# Patient Record
Sex: Female | Born: 1985 | Race: White | Hispanic: No | Marital: Married
Health system: Southern US, Community
[De-identification: ages and names within clinical notes are randomized; demographics above are authoritative.]

---

## 2019-12-09 ENCOUNTER — Telehealth: Payer: Self-pay | Admitting: Oncology

## 2019-12-09 NOTE — Telephone Encounter (Signed)
Received a new hem referral from Dr. Farris Has for low ferritin. Jodi Gibson returned my call and has been scheduled to see Dr. Clelia Croft on 8/27 at 11am. She has been made aware to arrive 15 minutes early.

## 2019-12-24 ENCOUNTER — Inpatient Hospital Stay: Payer: PRIVATE HEALTH INSURANCE | Attending: Oncology | Admitting: Oncology

## 2019-12-24 ENCOUNTER — Other Ambulatory Visit: Payer: Self-pay

## 2019-12-24 VITALS — BP 110/66 | HR 58 | Temp 96.1°F | Resp 18 | Wt 144.7 lb

## 2019-12-24 DIAGNOSIS — Z79899 Other long term (current) drug therapy: Secondary | ICD-10-CM | POA: Diagnosis not present

## 2019-12-24 DIAGNOSIS — E611 Iron deficiency: Secondary | ICD-10-CM | POA: Diagnosis present

## 2019-12-24 DIAGNOSIS — D509 Iron deficiency anemia, unspecified: Secondary | ICD-10-CM

## 2019-12-24 NOTE — Progress Notes (Signed)
Reason for the request:    Iron deficiency  HPI: I was asked by Dr. Kateri Plummer to evaluate Jodi Gibson for the evaluation of iron deficiency.  She is a 34 year old woman without any significant comorbid conditions was diagnosed with iron deficiency in the last year.  She was started on iron replacement by Dr. Kateri Plummer and has been taking it for the last year.  She had issues with dyspepsia and constipation but despite that she was compliant with it.  Repeat iron studies on November 30, 2019 showed ferritin of 7.8, iron level of 15 and iron binding capacity increased to 498.  Her hemoglobin was 13.3 with normal CBC otherwise.  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  She denies hematochezia, melena or hemoptysis.  She does have regular menstrual cycles but have not been heavy.  She does report some fatigue and some tiredness in addition to her alopecia.  She does not report any headaches, blurry vision, syncope or seizures. Does not report any fevers, chills or sweats.  Does not report any cough, wheezing or hemoptysis.  Does not report any chest pain, palpitation, orthopnea or leg edema.  Does not report any nausea, vomiting or abdominal pain.  Does not report any constipation or diarrhea.  Does not report any skeletal complaints.    Does not report frequency, urgency or hematuria.  Does not report any skin rashes or lesions. Does not report any heat or cold intolerance.  Does not report any lymphadenopathy or petechiae.  Does not report any anxiety or depression.  Remaining review of systems is negative.    No past medical history significant for hypertension, diabetes or coronary disease.    Social History   Socioeconomic History  . Marital status: Married    Spouse name: Not on file  . Number of children: Not on file  . Years of education: Not on file  . Highest education level: Not on file  Occupational History  . Not on file  Tobacco  Use  . Smoking status: Not on file  Substance and Sexual Activity  . Alcohol use: Not on file  . Drug use: Not on file  . Sexual activity: Not on file  Other Topics Concern  . Not on file  Social History Narrative  . Not on file   Social Determinants of Health   Financial Resource Strain:   . Difficulty of Paying Living Expenses: Not on file  Food Insecurity:   . Worried About Programme researcher, broadcasting/film/video in the Last Year: Not on file  . Ran Out of Food in the Last Year: Not on file  Transportation Needs:   . Lack of Transportation (Medical): Not on file  . Lack of Transportation (Non-Medical): Not on file  Physical Activity:   . Days of Exercise per Week: Not on file  . Minutes of Exercise per Session: Not on file  Stress:   . Feeling of Stress : Not on file  Social Connections:   . Frequency of Communication with Friends and Family: Not on file  . Frequency of Social Gatherings with Friends and Family: Not on file  . Attends Religious Services: Not on file  . Active Member of Clubs or Organizations: Not on file  . Attends Banker Meetings: Not on file  . Marital Status: Not on file  Intimate Partner Violence:   . Fear of Current or Ex-Partner: Not on file  . Emotionally Abused:  Not on file  . Physically Abused: Not on file  . Sexually Abused: Not on file  :  Pertinent items are noted in HPI.  Exam:  Blood pressure 110/66, pulse (!) 58, temperature (!) 96.1 F (35.6 C), temperature source Tympanic, resp. rate 18, weight 144 lb 11.2 oz (65.6 kg), SpO2 100 %.   ECOG 0 General appearance: alert and cooperative appeared without distress. Head: atraumatic without any abnormalities. Eyes: conjunctivae/corneas clear. PERRL.  Sclera anicteric. Throat: lips, mucosa, and tongue normal; without oral thrush or ulcers. Resp: clear to auscultation bilaterally without rhonchi, wheezes or dullness to percussion. Cardio: regular rate and rhythm, S1, S2 normal, no murmur,  click, rub or gallop GI: soft, non-tender; bowel sounds normal; no masses,  no organomegaly Skin: Skin color, texture, turgor normal. No rashes or lesions Lymph nodes: Cervical, supraclavicular, and axillary nodes normal. Neurologic: Grossly normal without any motor, sensory or deep tendon reflexes. Musculoskeletal: No joint deformity or effusion.     Assessment and Plan:   34 year old with:  1.  Iron deficiency diagnosed 2020 after presenting with low ferritin and normal hemoglobin since that time.  After oral iron replacement her ferritin continues to be low at 7.8 despite taking it regularly with excellent compliance with vitamin C.  The differential diagnosis of her iron deficiency was reviewed today.  She is likely has developed for iron absorption through her GI tract that will likely lead to need for intravenous iron.  Given her poor tolerance and the lack of effectiveness IV iron replacement remains the standard of care at this time.  There is no evidence of GI blood losses and chronic menstrual blood losses could also be contributing.  Risks and benefits of Feraheme infusion were reviewed today.  Complications such as arthralgias, myalgias and infusion related reactions were reviewed.  Rarely anaphylaxis could be documented.  After discussion today she is agreeable to proceed at this time.  2.  Follow-up: I have recommended repeating iron studies in 3 to 4 months to ensure adequate replacement.   45  minutes were dedicated to this visit. The time was spent on reviewing laboratory data, discussing treatment options, discussing differential diagnosis and answering questions regarding future plan.    A copy of this consult has been forwarded to the requesting physician.

## 2019-12-27 ENCOUNTER — Telehealth: Payer: Self-pay | Admitting: Oncology

## 2019-12-27 NOTE — Telephone Encounter (Signed)
Scheduled per 08/27 los, patient has been called and voicemail was left. 

## 2019-12-28 ENCOUNTER — Inpatient Hospital Stay: Payer: PRIVATE HEALTH INSURANCE

## 2019-12-28 ENCOUNTER — Telehealth: Payer: Self-pay | Admitting: Oncology

## 2019-12-28 NOTE — Telephone Encounter (Signed)
Scheduled per 08/27 los, called patient regarding upcoming scheduled appointments. Left a voicemail.

## 2019-12-30 ENCOUNTER — Inpatient Hospital Stay: Payer: PRIVATE HEALTH INSURANCE

## 2020-01-07 ENCOUNTER — Inpatient Hospital Stay: Payer: PRIVATE HEALTH INSURANCE

## 2020-01-07 ENCOUNTER — Inpatient Hospital Stay: Payer: PRIVATE HEALTH INSURANCE | Attending: Oncology

## 2020-01-07 ENCOUNTER — Other Ambulatory Visit: Payer: Self-pay

## 2020-01-07 VITALS — BP 102/65 | HR 51 | Temp 98.3°F | Resp 18

## 2020-01-07 DIAGNOSIS — D509 Iron deficiency anemia, unspecified: Secondary | ICD-10-CM

## 2020-01-07 DIAGNOSIS — E611 Iron deficiency: Secondary | ICD-10-CM | POA: Diagnosis present

## 2020-01-07 MED ORDER — SODIUM CHLORIDE 0.9 % IV SOLN
Freq: Once | INTRAVENOUS | Status: AC
  Filled 2020-01-07: qty 250

## 2020-01-07 MED ORDER — SODIUM CHLORIDE 0.9 % IV SOLN
510.0000 mg | Freq: Once | INTRAVENOUS | Status: AC
  Administered 2020-01-07: 510 mg via INTRAVENOUS
  Filled 2020-01-07: qty 510

## 2020-01-07 NOTE — Patient Instructions (Signed)

## 2020-01-18 ENCOUNTER — Inpatient Hospital Stay: Payer: PRIVATE HEALTH INSURANCE

## 2020-01-18 ENCOUNTER — Other Ambulatory Visit: Payer: Self-pay

## 2020-01-18 VITALS — BP 96/58 | HR 47 | Temp 98.7°F | Resp 18

## 2020-01-18 DIAGNOSIS — D509 Iron deficiency anemia, unspecified: Secondary | ICD-10-CM

## 2020-01-18 DIAGNOSIS — E611 Iron deficiency: Secondary | ICD-10-CM | POA: Diagnosis not present

## 2020-01-18 MED ORDER — SODIUM CHLORIDE 0.9 % IV SOLN
510.0000 mg | Freq: Once | INTRAVENOUS | Status: AC
  Administered 2020-01-18: 510 mg via INTRAVENOUS
  Filled 2020-01-18: qty 17

## 2020-01-18 MED ORDER — SODIUM CHLORIDE 0.9 % IV SOLN
Freq: Once | INTRAVENOUS | Status: AC
  Filled 2020-01-18: qty 250

## 2020-01-18 NOTE — Patient Instructions (Signed)

## 2020-05-31 ENCOUNTER — Ambulatory Visit
Admission: RE | Admit: 2020-05-31 | Discharge: 2020-05-31 | Disposition: A | Discharge status: Home / Self Care | Payer: PRIVATE HEALTH INSURANCE | Source: Ambulatory Visit | Attending: Family Medicine | Admitting: Family Medicine

## 2020-05-31 ENCOUNTER — Other Ambulatory Visit: Payer: Self-pay

## 2020-05-31 ENCOUNTER — Other Ambulatory Visit: Payer: Self-pay | Admitting: Family Medicine

## 2020-05-31 DIAGNOSIS — R0602 Shortness of breath: Secondary | ICD-10-CM

## 2021-04-16 ENCOUNTER — Ambulatory Visit (HOSPITAL_COMMUNITY)
Admission: RE | Admit: 2021-04-16 | Discharge: 2021-04-16 | Disposition: A | Discharge status: Home / Self Care | Payer: No Typology Code available for payment source | Source: Ambulatory Visit | Attending: General Surgery | Admitting: General Surgery

## 2021-04-16 ENCOUNTER — Other Ambulatory Visit: Payer: No Typology Code available for payment source | Admitting: General Surgery

## 2021-04-16 ENCOUNTER — Encounter: Payer: Self-pay | Admitting: Oncology

## 2021-04-16 ENCOUNTER — Other Ambulatory Visit: Payer: Self-pay | Admitting: General Surgery

## 2021-04-16 ENCOUNTER — Other Ambulatory Visit: Payer: Self-pay

## 2021-04-16 DIAGNOSIS — M7989 Other specified soft tissue disorders: Secondary | ICD-10-CM | POA: Insufficient documentation

## 2021-04-16 DIAGNOSIS — Z09 Encounter for follow-up examination after completed treatment for conditions other than malignant neoplasm: Secondary | ICD-10-CM

## 2021-04-16 NOTE — Progress Notes (Signed)
Acute right leg swelling

## 2023-11-04 ENCOUNTER — Ambulatory Visit (INDEPENDENT_AMBULATORY_CARE_PROVIDER_SITE_OTHER): Admitting: Podiatry

## 2023-11-04 ENCOUNTER — Encounter: Payer: Self-pay | Admitting: Podiatry

## 2023-11-04 DIAGNOSIS — L603 Nail dystrophy: Secondary | ICD-10-CM

## 2023-11-04 NOTE — Progress Notes (Signed)
  Subjective:  Patient ID: Jodi Gibson, female    DOB: 04/30/1985,   MRN: [id number]  Chief Complaint  Patient presents with   Nail Problem    Patient states that she hit her right hallux toe nail on step walking into her house about a month ago.  her toe nail got caught and came off, her nail grew back but now it hurts on the lateral side of her right hallux and patient would like to know how to fix her nail due to it becoming a pinched nail, and hurting on lateral and medial sides of right hallux. No mediation for pain, no drainage    38 y.o. female presents for concern of right great toenail. Relates she tripped and part of the nail was ripped off. This occurred about a month ago. Now the nail has grown back and hurting on the lateral side of the nail. She is not sure what to do for it. Denies any treatments.  Denies any other pedal complaints. Denies n/v/f/c.   History reviewed. No pertinent past medical history.  Objective:  Physical Exam: Vascular: DP/PT pulses 2/4 bilateral. CFT <3 seconds. Normal hair growth on digits. No edema.  Skin. No lacerations or abrasions bilateral feet. Right hallux nail lifted centrall from nail bed with nail bed well healed. Adhered to lateral and medial side. Some tenderness to lateral aspect. No major incurvation noted Musculoskeletal: MMT 5/5 bilateral lower extremities in DF, PF, Inversion and Eversion. Deceased ROM in DF of ankle joint.  Neurological: Sensation intact to light touch.   Assessment:   1. Onychodystrophy      Plan:  Patient was evaluated and treated and all questions answered. Discussed dystrophic and ingrown toenails etiology and treatment options including procedure for removal vs conservative care.  Recommend continued conservative care with epsom salt soaks and neosporin. Also discussed tea tree oil to prevent fungal infection.  Discussed if still having trouble in another month to return for possible removal of nail  Return  as needed.    Asberry Failing, DPM

## 2023-11-11 ENCOUNTER — Ambulatory Visit: Admitting: Podiatry

## 2023-11-30 IMAGING — US US EXTREM LOW VENOUS*R*
1 series · 14 of 24 positions shown · non-contrast
Comparison: None.

CLINICAL DATA: right leg pain

EXAM:
RIGHT LOWER EXTREMITY VENOUS DOPPLER ULTRASOUND
TECHNIQUE: Gray-scale sonography with compression, as well as color and duplex
ultrasound, were performed to evaluate the deep venous system(s)
from the level of the common femoral vein through the popliteal and
proximal calf veins.

[Series 1: us venous img lower bilat (dvt) · portal-venous · 14 of 45 slices shown]
[im 1/45]
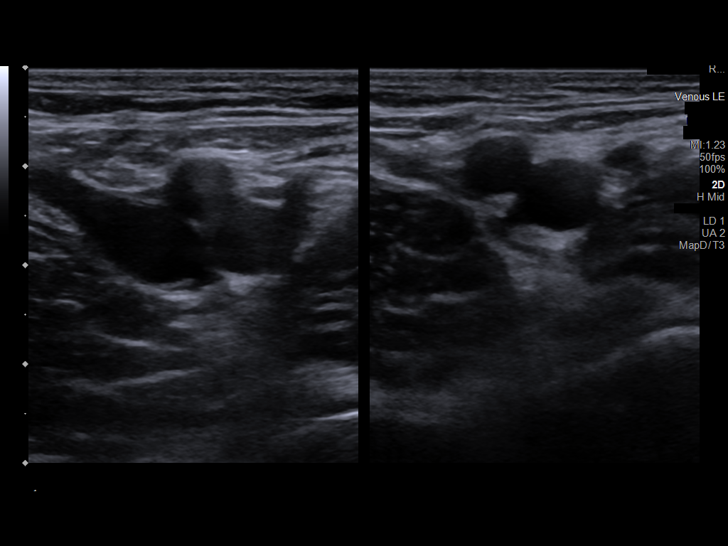
[im 4/45]
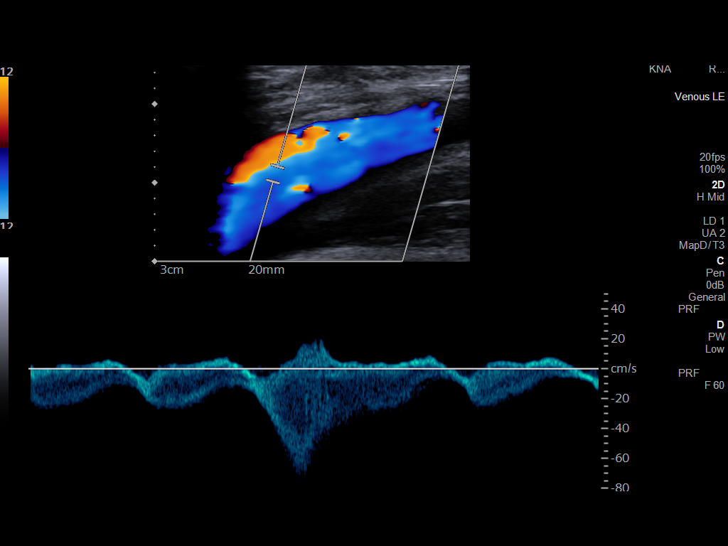
[im 8/45]
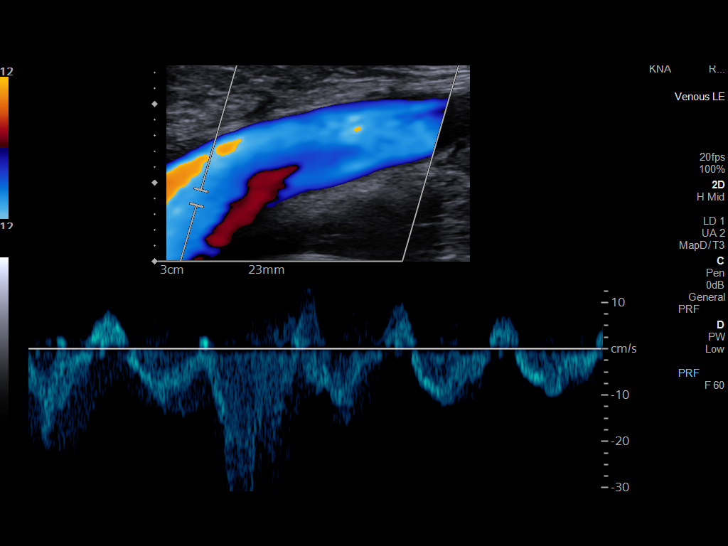
[im 12/45]
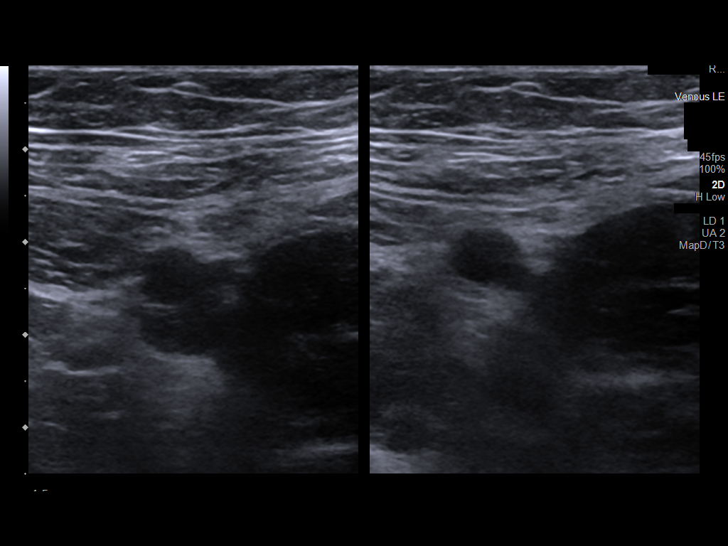
[im 14/45]
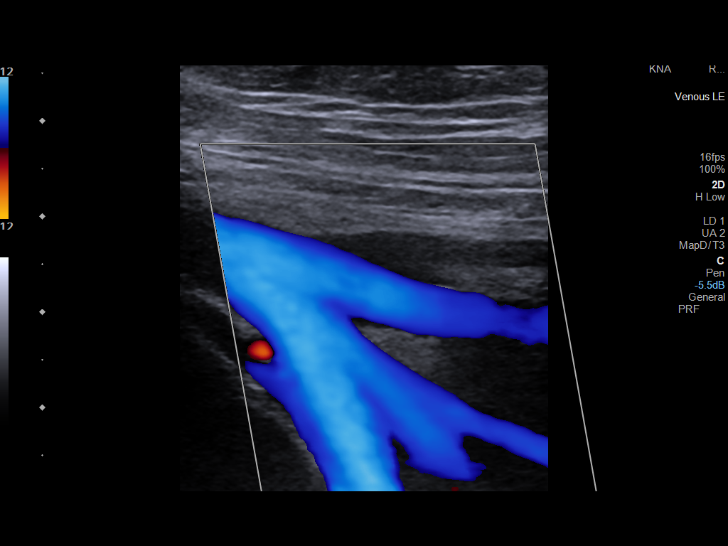
[im 18/45]
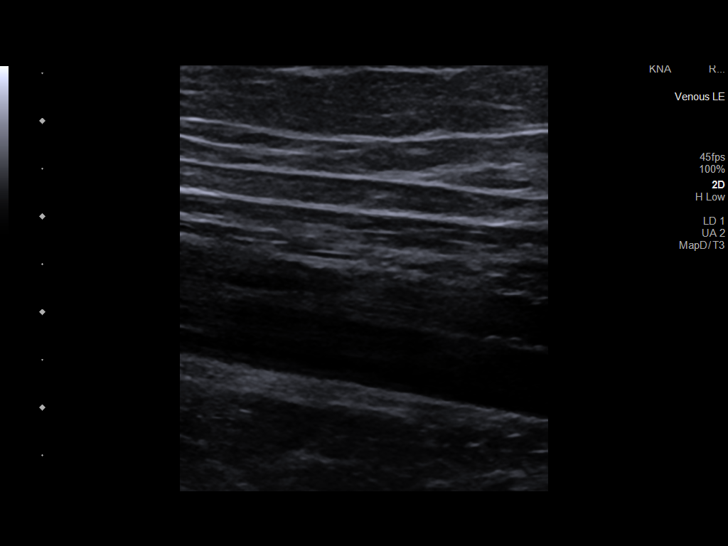
[im 22/45]
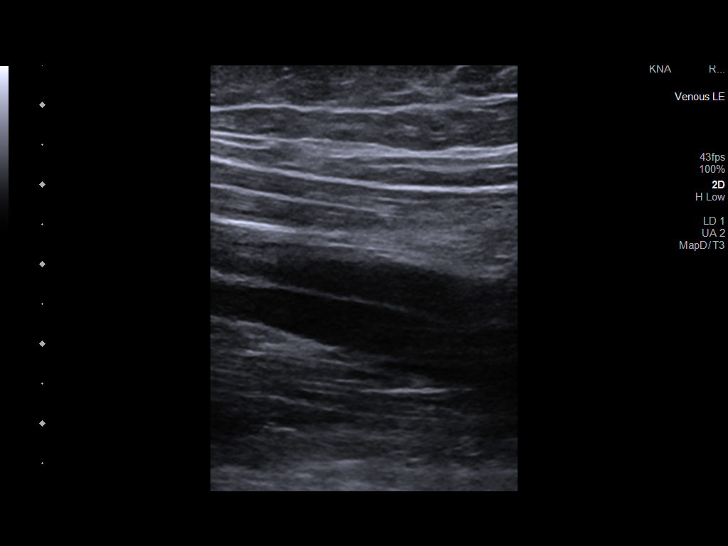
[im 23/45]
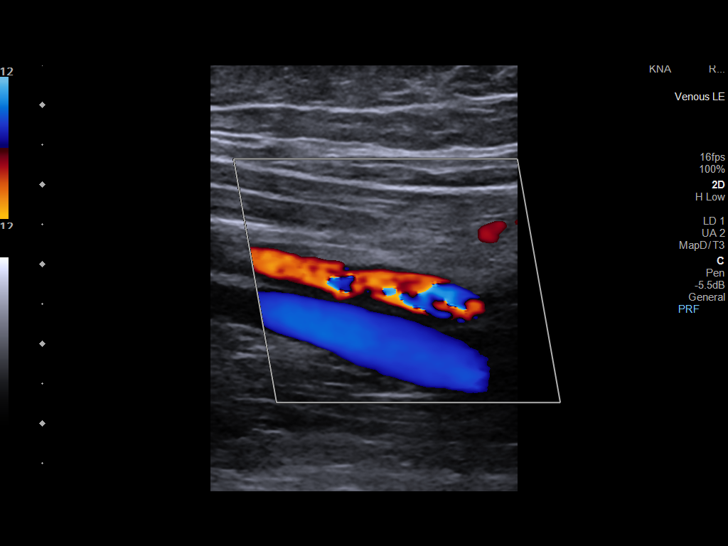
[im 27/45]
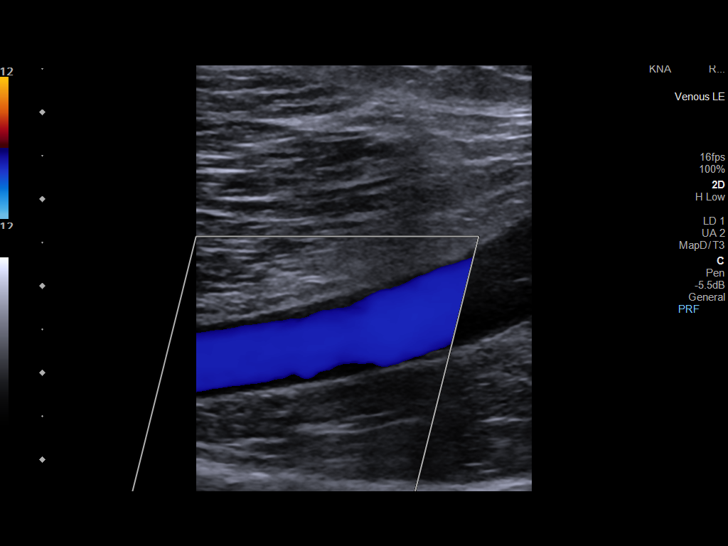
[im 31/45]
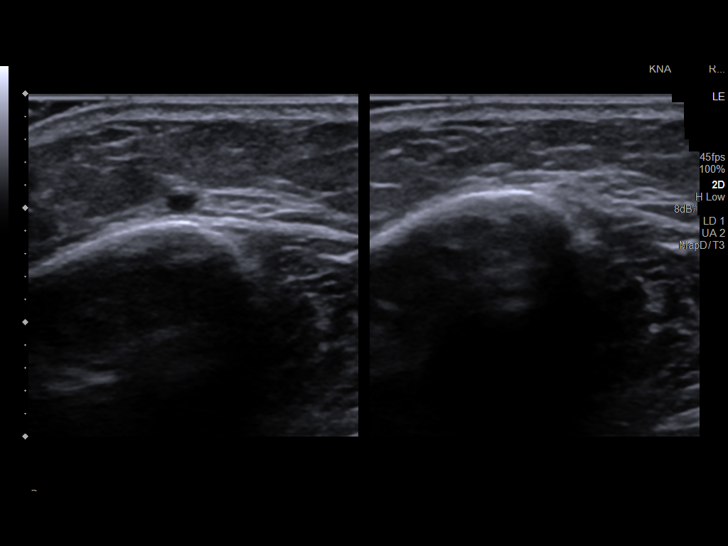
[im 35/45]
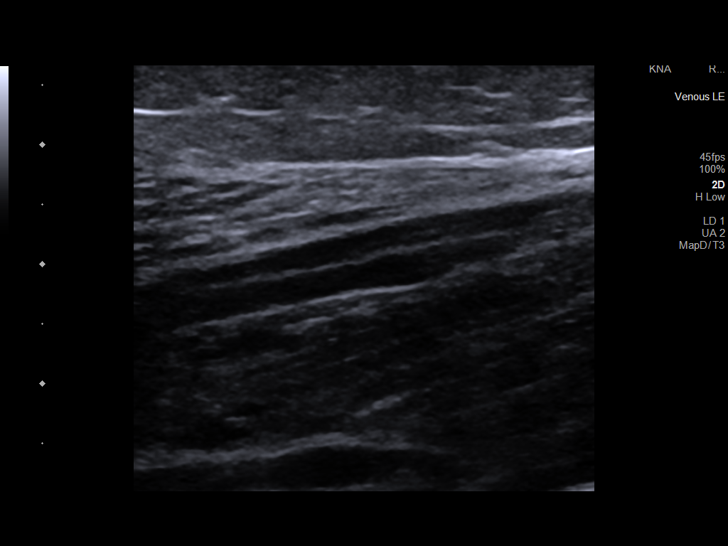
[im 37/45]
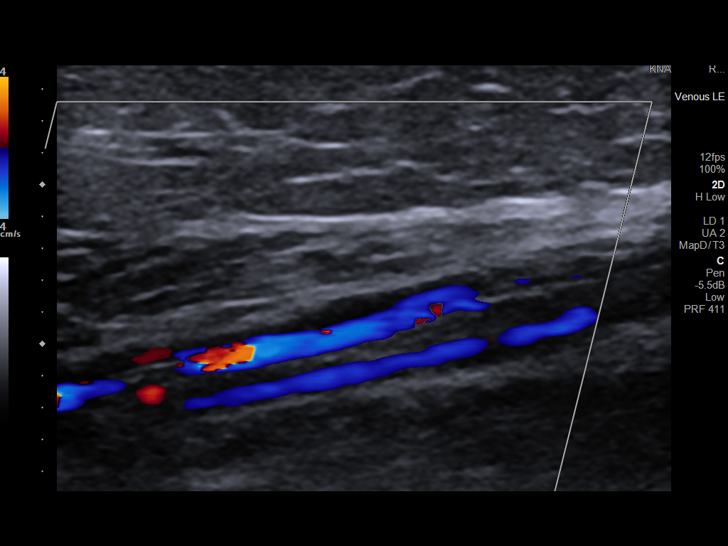
[im 41/45]
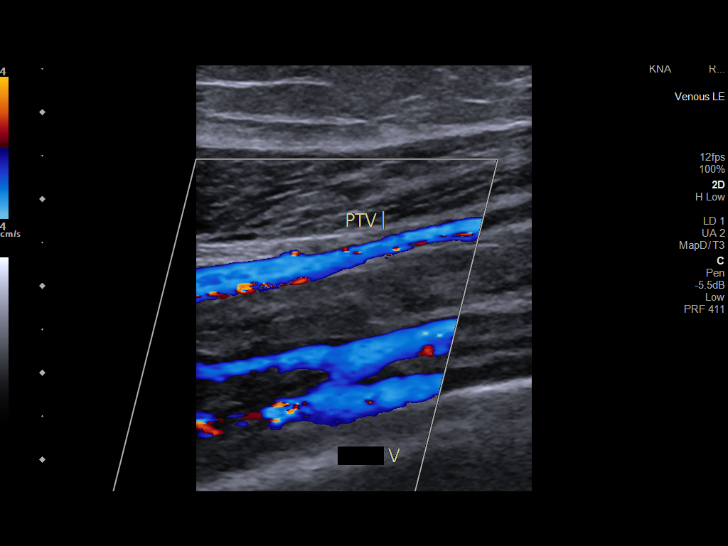
[im 45/45]
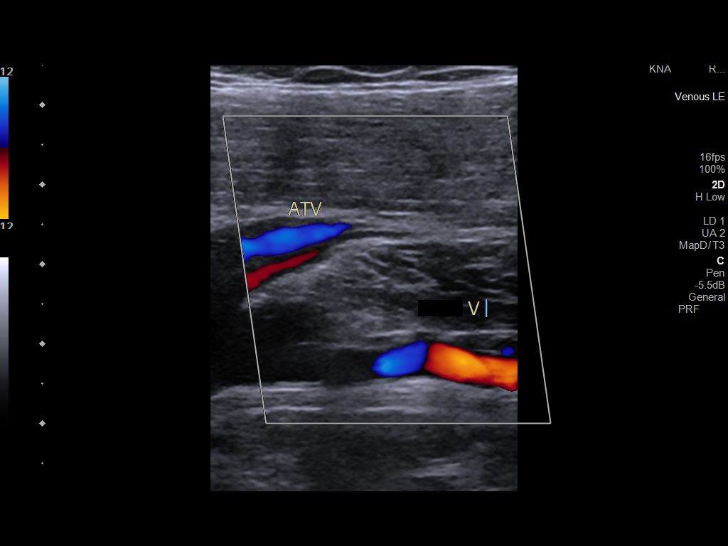

[14 of 24 positions shown; findings below may reference images not displayed]

FINDINGS: VENOUS

Normal compressibility of the common femoral, superficial femoral,
and popliteal veins, as well as the visualized calf veins.
Visualized portions of profunda femoral vein and great saphenous
vein unremarkable. No filling defects to suggest DVT on grayscale or
color Doppler imaging. Doppler waveforms show normal direction of
venous flow, normal respiratory plasticity and response to
augmentation.

Limited views of the contralateral common femoral vein are
unremarkable.
IMPRESSION: No evidence of DVT in the right lower extremity.

## 2023-12-26 ENCOUNTER — Ambulatory Visit (INDEPENDENT_AMBULATORY_CARE_PROVIDER_SITE_OTHER): Admitting: Podiatry

## 2023-12-26 ENCOUNTER — Encounter: Payer: Self-pay | Admitting: Podiatry

## 2023-12-26 DIAGNOSIS — L601 Onycholysis: Secondary | ICD-10-CM

## 2023-12-26 DIAGNOSIS — L603 Nail dystrophy: Secondary | ICD-10-CM | POA: Diagnosis not present

## 2023-12-26 NOTE — Progress Notes (Signed)
  Subjective:  Patient ID: Jodi Gibson, female    DOB: [birth year],   MRN: [id number]  Chief Complaint  Patient presents with   Nail Problem     RM 8 right big toe follow up (requested different provider). Right hallux nail has lifted, patient is interested in removal. Patient is currently treating right foot toe nails with tea tree oil( oil has discolored toes nails) to prevent fungal infection.     38 y.o. female presents for concern of right great toenail.  Nail plate continues to be loose.  She is concerned about developing a nail deformity or fungal nail infection.  She is considering removal but she does state that she is going to a wedding tomorrow and wearing tight fitting shoes.  History reviewed. No pertinent past medical history.  Objective:  Physical Exam: Vascular: DP/PT pulses 2/4 bilateral. CFT <3 seconds. Normal hair growth on digits. No edema.  Skin. No lacerations or abrasions bilateral feet. Right hallux nail lifted centrall from nail bed with nail bed well healed. Adhered to lateral and medial side. Some tenderness to lateral aspect. No major incurvation noted Musculoskeletal: MMT 5/5 bilateral lower extremities in DF, PF, Inversion and Eversion. Deceased ROM in DF of ankle joint.  Neurological: Sensation intact to light touch.   Assessment:   1. Onychodystrophy   2. Nail plate separation      Plan:  Patient was evaluated and treated and all questions answered. Discussed dystrophic and ingrown toenails etiology and treatment options including procedure for removal vs conservative care.  She has concerned about developing nail deformity.  Do think it is reasonable to perform temporary removal of the toenail.  She will be going to a wedding tomorrow and wearing tight fitting shoes therefore do think we can wait on performing the procedure.  Will have patient follow-up in 1 to 2 weeks to have this done.  Discussed with patient, she is in agreement.  All questions and  concerns addressed.  Risk benefits alternative therapies were discussed at length.   Kourosh Jablonsky L. Lamount, DPM

## 2024-01-02 ENCOUNTER — Encounter: Payer: Self-pay | Admitting: Podiatry

## 2024-01-02 ENCOUNTER — Ambulatory Visit (INDEPENDENT_AMBULATORY_CARE_PROVIDER_SITE_OTHER): Admitting: Podiatry

## 2024-01-02 DIAGNOSIS — L601 Onycholysis: Secondary | ICD-10-CM | POA: Diagnosis not present

## 2024-01-02 MED ORDER — NEOMYCIN-POLYMYXIN-HC 3.5-10000-1 OT SUSP: OTIC | 0 refills | Status: AC

## 2024-01-02 NOTE — Patient Instructions (Signed)
 Place 1/4 cup of epsom salts in a quart of warm tap water.  Submerge your foot or feet in the solution and soak for 20 minutes.  This soak should be done twice a day.  Next, remove your foot or feet from solution, blot dry the affected area. Apply antibiotic drops and cover if instructed by your doctor.   IF YOUR SKIN BECOMES IRRITATED WHILE USING THESE INSTRUCTIONS, IT IS OKAY TO SWITCH TO  WHITE VINEGAR AND WATER.  As another alternative soak, you may use antibacterial soap and water.  Monitor for any signs/symptoms of infection. Call the office immediately if any occur or go directly to the emergency room. Call with any questions/concerns.

## 2024-01-02 NOTE — Progress Notes (Signed)
 Subjective:  Patient ID: Jodi Gibson, female    DOB: Nov 01, 1985,  MRN: [id number]  Jodi Gibson presents to clinic today for:  Chief Complaint  Patient presents with   Ingrown Toenail    Pt here for nail avulsion R Great toe nail has lifted from tissue 90%.  Non diabetic no anti coag  Patient presenting today for right total nail avulsion procedure.  Nail plate continues to loosen.  She is very concerned about getting ripped off.  She denies signs of infection.  PCP is Kip Righter, MD.  No Known Allergies  Review of Systems: Negative except as noted in the HPI.  Objective:  There were no vitals filed for this visit.  Jodi Gibson is a pleasant 38 y.o. female in NAD. AAO x 3.  Vascular Examination: Capillary refill time is less than 3 seconds to toes bilateral. Palpable pedal pulses b/l LE. Digital hair present b/l. No pedal edema b/l. Skin temperature gradient WNL b/l. No varicosities b/l. No cyanosis or clubbing noted b/l.   Dermatological Examination: Nail plate separation right first toe close 90% from the nailbed.  It is firmly adhered medial border.  There is area of new nail growth  proximal 20% of the nailbed as well.  Neurological Examination: Protective sensation intact with Semmes-Weinstein 10 gram monofilament b/l LE. Vibratory sensation intact b/l LE.      No data to display           Assessment/Plan: 1. Nail plate separation     Meds ordered this encounter  Medications   neomycin -polymyxin-hydrocortisone (CORTISPORIN) 3.5-10000-1 OTIC suspension    Sig: Apply 1-2 drops daily after soaking and cover with bandaid    Dispense:  10 mL    Refill:  0  # Nail plate separation right first toe -Here for planned total nail avulsion procedure without chemical matrixectomy - Discussed risk, benefits and alternative therapies with patient.  She is agreeableness expressed good understanding.  Written consent obtained. - Proceeding with total nail  avulsion to facilitate healthy nail regrowth and minimize the form any.  Does understand that there is a likelihood of nail deformity following this.   Discussed patient's condition today.  After obtaining patient consent, the right 1st toenail was anesthetized with a 50:50 mixture of 1% lidocaine plain and 0.5% bupivacaine plain for a total of 3cc's administered.  The toe was then prepped with Betadine.  Upon confirmation of anesthesia, a freer elevator was utilized to free the right hallux nail plate from the nail bed.  The nail plate was then avulsed proximal to the eponychium and removed in toto.  The area was inspected for any remaining spicules.  No chemical matrixectomy was performed.  Antibiotic ointment and a DSD were applied, followed by a Coban dressing.  Patient tolerated the anesthetic and procedure well and will f/u in 2-3 weeks for recheck.  Patient given post-procedure instructions for daily 20-minute Epsom salt soaks, antibiotic ointment and daily use of Bandaids until toe starts to dry / form eschar.   Prescription for antibiotic Corticosporin drops prescribed to patient for aftercare of the nail avulsion site.  Return in about 2 weeks (around 01/16/2024) for Nail Check.   Ethan Saddler, DPM, AACFAS [facility]     [address]  [place], [place] [address]                Office [phone]  Fax [phone]

## 2024-01-07 ENCOUNTER — Telehealth: Payer: Self-pay | Admitting: Lab

## 2024-01-07 NOTE — Telephone Encounter (Signed)
 Needing work note to be out of work until her foot heals unable to wear a shoe and is in pain.

## 2024-01-23 ENCOUNTER — Encounter: Payer: Self-pay | Admitting: Podiatry

## 2024-01-23 ENCOUNTER — Ambulatory Visit (INDEPENDENT_AMBULATORY_CARE_PROVIDER_SITE_OTHER): Admitting: Podiatry

## 2024-01-23 DIAGNOSIS — L601 Onycholysis: Secondary | ICD-10-CM

## 2024-01-23 NOTE — Progress Notes (Signed)
       Subjective:  Patient ID: Jodi Gibson, female    DOB: [birth year],  MRN: [id number]  Chief Complaint  Patient presents with   Nail Problem    Re check on R great to nail.  Had pain swelling x 2 weeks.  Is feeling better now and feel like it is heeling well now.  Has not worn tennis shoes.  Not diabetic no anti coag     Jodi Gibson presents to clinic today for f/u of right total nail avulsion.  Reports that she was dealing with pain during recovery but is doing well now.  No pain at this point.  Reports the area is scabbed over.  PCP is Kip Righter, MD.  No Known Allergies  Objective:  There were no vitals filed for this visit.  Vascular Examination: Capillary refill time is less than 3 seconds to toes bilateral. Palpable pedal pulses b/l LE. Digital hair present b/l. No pedal edema b/l. Skin temperature gradient WNL b/l. No varicosities b/l. No cyanosis or clubbing noted b/l.   Dermatological Examination: Status post right first toe total nail avulsion.  Area scabbed over.  No erythema.  No edema.  No tenderness on palpation.  No signs of infection.  Appears to be healing well.  Assessment/Plan: 1. Nail plate separation     No orders of the defined types were placed in this encounter.  Findings discussed with patient.  Appears to be healing well.  Monitor for signs of nail deformity as the nail grows out.  Return if symptoms worsen or fail to improve.   , DPM, FACFAS [facility]     [address] [place], [place] [address]                Office [phone]  Fax [phone]

## 2024-01-23 NOTE — Patient Instructions (Signed)
 As your nail plate grows out, they may struggle growing out over the nailbed calluses over.  You may get some benefit from applying a urea-based callus softening cream to the nailbed and massaging it to this area and on the skin at the end of the nail plate has a begins to grow out.  You may do this 1-2 times a day.
